# Patient Record
Sex: Male | Born: 1960 | Race: White | Hispanic: No | Marital: Married | Smoking: Never smoker
Health system: Southern US, Community
[De-identification: ages and names within clinical notes are randomized; demographics above are authoritative.]

---

## 2006-07-14 ENCOUNTER — Encounter: Admission: RE | Admit: 2006-07-14 | Discharge: 2006-07-14 | Payer: Self-pay | Admitting: Orthopedic Surgery

## 2015-03-30 ENCOUNTER — Ambulatory Visit (INDEPENDENT_AMBULATORY_CARE_PROVIDER_SITE_OTHER): Payer: BLUE CROSS/BLUE SHIELD | Admitting: Physician Assistant

## 2015-03-30 ENCOUNTER — Encounter: Payer: Self-pay | Admitting: Physician Assistant

## 2015-03-30 ENCOUNTER — Ambulatory Visit (INDEPENDENT_AMBULATORY_CARE_PROVIDER_SITE_OTHER): Payer: BLUE CROSS/BLUE SHIELD

## 2015-03-30 VITALS — BP 144/76 | HR 76 | Temp 98.3°F | Resp 18

## 2015-03-30 DIAGNOSIS — M79675 Pain in left toe(s): Secondary | ICD-10-CM | POA: Diagnosis not present

## 2015-03-30 DIAGNOSIS — S91212A Laceration without foreign body of left great toe with damage to nail, initial encounter: Secondary | ICD-10-CM

## 2015-03-30 DIAGNOSIS — S91119A Laceration without foreign body of unspecified toe without damage to nail, initial encounter: Secondary | ICD-10-CM

## 2015-03-30 DIAGNOSIS — Z23 Encounter for immunization: Secondary | ICD-10-CM | POA: Diagnosis not present

## 2015-03-30 DIAGNOSIS — S99922A Unspecified injury of left foot, initial encounter: Secondary | ICD-10-CM

## 2015-03-30 MED ORDER — CEPHALEXIN 500 MG PO CAPS: 500.0000 mg | ORAL_CAPSULE | Freq: Three times a day (TID) | ORAL | Status: DC

## 2015-03-30 MED ORDER — HYDROCODONE-ACETAMINOPHEN 5-325 MG PO TABS: 1.0000 | ORAL_TABLET | Freq: Four times a day (QID) | ORAL | Status: DC | PRN

## 2015-03-30 NOTE — Patient Instructions (Signed)

## 2015-03-30 NOTE — Progress Notes (Signed)
   Subjective:    Patient ID: Philip Tucker, male    DOB: 02-01-1961, 54 y.o.   MRN: [id number]  HPI Patient present for left toe pain and laceration following a steel plate from a [employer] machine falling onto his toe this morning. Toe is no longer painful, but is now numb, however, when he was driving here toe pain was intense and felt nauseous. Not sure when he last received tetanus. NKDA.   Review of Systems  Constitutional: Positive for diaphoresis. Negative for fever and chills.  Gastrointestinal: Positive for nausea. Negative for vomiting.  Musculoskeletal: Positive for gait problem.       Great toe laceration   Skin: Positive for wound.       Objective:   Physical Exam  Constitutional: He is oriented to person, place, and time. He appears well-developed and well-nourished. No distress.  Blood pressure 144/76, pulse 76, temperature 98.3 F (36.8 C), temperature source Oral, resp. rate 18, SpO2 99 %.   HENT:  Head: Normocephalic and atraumatic.  Right Ear: External ear normal.  Left Ear: External ear normal.  Eyes: Conjunctivae are normal. Right eye exhibits no discharge. Left eye exhibits no discharge. No scleral icterus.  Pulmonary/Chest: Effort normal.  Musculoskeletal:       Left foot: There is decreased range of motion (secondary to pain), tenderness and laceration (left great toe). There is no bony tenderness and no swelling.       Feet:  Neurological: He is alert and oriented to person, place, and time. He displays no atrophy. No sensory deficit. He exhibits normal muscle tone.  Skin: Skin is warm. He is not diaphoretic. There is pallor.  Psychiatric: He has a normal mood and affect. His behavior is normal. Judgment and thought content normal.   [facility] reading (PRIMARY) by  Dr. Milus Glazier.  No acute bony abnormalities.  Procedure Consent obtained. Digital block with 1:1 1% lido:0.05% marcaine mix. Wound cleaned with soap and water then explored. Toenail removed. Top  of distal phalanx exposed. #18 5-0 ethilon simple interrupted sutures placed. Clean dressing placed. Care instructions discussed.     Assessment & Plan:  1. Toe laceration, initial encounter 2. Toe injury, left, initial encounter 3. Pain of toe of left foot RTC 04/01/15 for wound check.  - Tdap vaccine greater than or equal to 7yo IM - cephALEXin (KEFLEX) 500 MG capsule; Take 1 capsule (500 mg total) by mouth 3 (three) times daily.  Dispense: 15 capsule; Refill: 0 - DG Toe Great Left; Future - HYDROcodone-acetaminophen (NORCO) 5-325 MG per tablet; Take 1 tablet by mouth every 6 (six) hours as needed for moderate pain.  Dispense: 30 tablet; Refill: 0   Olive Zmuda PA-C  [facility] 03/30/2015 2:10 PM

## 2015-04-01 ENCOUNTER — Ambulatory Visit (INDEPENDENT_AMBULATORY_CARE_PROVIDER_SITE_OTHER): Payer: BLUE CROSS/BLUE SHIELD | Admitting: Physician Assistant

## 2015-04-01 VITALS — BP 147/89 | HR 89 | Temp 98.9°F | Resp 18 | Ht 72.0 in | Wt 255.0 lb

## 2015-04-01 DIAGNOSIS — S91119D Laceration without foreign body of unspecified toe without damage to nail, subsequent encounter: Secondary | ICD-10-CM

## 2015-04-01 DIAGNOSIS — S99922D Unspecified injury of left foot, subsequent encounter: Secondary | ICD-10-CM

## 2015-04-01 DIAGNOSIS — Z5189 Encounter for other specified aftercare: Secondary | ICD-10-CM

## 2015-04-01 DIAGNOSIS — S91212A Laceration without foreign body of left great toe with damage to nail, initial encounter: Secondary | ICD-10-CM

## 2015-04-01 NOTE — Progress Notes (Signed)
   Subjective:    Patient ID: Philip Tucker, male    DOB: 09/13/1960, 54 y.o.   MRN: [id number]  HPI Patient presents for wound care following laceration repair of great on of left foot when a steel plate fell onto it. States that pain has been manageable up until today when a toddler crawled onto foot. Has not taken Norco at all and has only used Aleve twice over the weekend. Taking Keflex without any side effects. Denies fever or redness. Has not changed dressing so unaware of any drainage. Did have some bleeding the first 2 days.   Review of Systems  Constitutional: Negative for fever and chills.  Musculoskeletal: Negative for joint swelling.  Skin: Positive for wound. Negative for color change.  Neurological: Negative for numbness.       Objective:   Physical Exam  Constitutional: He is oriented to person, place, and time. He appears well-developed and well-nourished. No distress.  Blood pressure 147/89, pulse 89, temperature 98.9 F (37.2 C), temperature source Oral, resp. rate 18, height 6' (1.829 m), weight 255 lb (115.667 kg), SpO2 98 %.   HENT:  Head: Normocephalic and atraumatic.  Right Ear: External ear normal.  Left Ear: External ear normal.  Eyes: Conjunctivae are normal. Right eye exhibits no discharge. Left eye exhibits no discharge. No scleral icterus.  Pulmonary/Chest: Effort normal.  Neurological: He is alert and oriented to person, place, and time.  Skin: Skin is warm. No rash noted. He is not diaphoretic. No erythema. No pallor.  Soaked dressing off of toe. Underlying tissue is healing well where skin was macerated. Sutures intact. No erythema or drainage. Macerated skin bleeding with telfa removal.   Psychiatric: He has a normal mood and affect. His behavior is normal. Judgment and thought content normal.       Assessment & Plan:  1. Toe laceration, subsequent encounter 2. Toe injury, left, subsequent encounter 3. Visit for wound check Wound healing well.  RTC 04/11/15 for suture removal. Continue keflex. Norco prn. Wound care re-discussed.    Janan Ridge PA-C  [facility] 04/01/2015 6:41 PM

## 2015-04-11 ENCOUNTER — Ambulatory Visit (INDEPENDENT_AMBULATORY_CARE_PROVIDER_SITE_OTHER): Payer: BLUE CROSS/BLUE SHIELD | Admitting: Physician Assistant

## 2015-04-11 VITALS — BP 143/92 | HR 86 | Temp 98.2°F | Ht 72.0 in | Wt 254.1 lb

## 2015-04-11 DIAGNOSIS — Z5189 Encounter for other specified aftercare: Secondary | ICD-10-CM | POA: Diagnosis not present

## 2015-04-11 DIAGNOSIS — S91119D Laceration without foreign body of unspecified toe without damage to nail, subsequent encounter: Secondary | ICD-10-CM | POA: Diagnosis not present

## 2015-04-11 MED ORDER — CEPHALEXIN 500 MG PO CAPS: 500.0000 mg | ORAL_CAPSULE | Freq: Four times a day (QID) | ORAL | Status: DC

## 2015-04-11 MED ORDER — DOXYCYCLINE HYCLATE 100 MG PO CAPS: 100.0000 mg | ORAL_CAPSULE | Freq: Two times a day (BID) | ORAL | Status: AC

## 2015-04-11 NOTE — Progress Notes (Signed)
   04/11/2015 at 8:52 PM  Alfred Levins / DOB: [birth year] / MRN: [id number]  The patient  does not have a problem list on file.  SUBJECTIVE  Mr. Ortega is a well appearing 54 y.o. here today for wound care. He denies exquisite tenderness at the site of the wound, and denies nausea, emesis, fever and chills. Reports he has been wearing work boots and walking up to 5 miles per day without difficulty.  He has been compliant with 10 days Keflex and changing the dressing daily.     He  has no past medical history on file.    Medications reviewed and updated by myself where necessary, and exist elsewhere in the encounter.   Mr. Schoen has No Known Allergies. He  reports that he has never smoked. He has never used smokeless tobacco. He reports that he does not drink alcohol or use illicit drugs. He  has no sexual activity history on file. The patient  has no past surgical history on file.  His family history includes Cancer in his father.  Review of Systems  Constitutional: Negative for fever and chills.  Respiratory: Negative for shortness of breath.   Cardiovascular: Negative for chest pain.  Gastrointestinal: Negative for nausea and abdominal pain.  Genitourinary: Negative.   Skin: Negative for rash.  Neurological: Negative for dizziness and headaches.    OBJECTIVE  His  height is 6' (1.829 m) and weight is 254 lb 2 oz (115.27 kg). His oral temperature is 98.2 F (36.8 C). His blood pressure is 143/92 and his pulse is 86. His oxygen saturation is 99%.  The patient's body mass index is 34.46 kg/(m^2).  Physical Exam  Vitals reviewed. Constitutional: He is oriented to person, place, and time. He appears well-developed. No distress.  Eyes: EOM are normal. Pupils are equal, round, and reactive to light. No scleral icterus.  Neck: Normal range of motion.  Cardiovascular: Normal rate and regular rhythm.   Respiratory: Effort normal and breath sounds normal.  GI: He exhibits no distension.    Musculoskeletal: Normal range of motion.       Feet:  Neurological: He is alert and oriented to person, place, and time. No cranial nerve deficit.  Skin: Skin is warm and dry. No rash noted. He is not diaphoretic.  Psychiatric: He has a normal mood and affect.    Procedure: Risk and benefits discussed and verbal consent obtained. The patient was anesthetized using 5 cc of 2% lidocaine via digital block. Sutures removed without complication. Xeroform applied to the nail bed and clean dressing placed.     No results found for this or any previous visit (from the past 24 hour(s)).  ASSESSMENT & PLAN  Nora was seen today for suture / staple removal.  Diagnoses and all orders for this visit:  Encounter for wound care: Patient anesthetized for suture removal.  Sutures removed without difficulty.     Toe laceration, subsequent encounter: Will continue to cover with Abx therapy per Dr. Irwin Brakeman recommendation. Patient to RTC in 5 days for re-eval. He is to leave the xeroform in place and change the dressing daily.       The patient was advised to call or come back to clinic if he does not see an improvement in symptoms, or worsens with the above plan.   Deliah Boston, MHS, PA-C [facility] 04/11/2015 8:52 PM

## 2015-12-03 DIAGNOSIS — R030 Elevated blood-pressure reading, without diagnosis of hypertension: Secondary | ICD-10-CM | POA: Diagnosis not present

## 2015-12-03 DIAGNOSIS — Z008 Encounter for other general examination: Secondary | ICD-10-CM | POA: Diagnosis not present

## 2015-12-03 DIAGNOSIS — E669 Obesity, unspecified: Secondary | ICD-10-CM | POA: Diagnosis not present

## 2015-12-03 DIAGNOSIS — Z6835 Body mass index (BMI) 35.0-35.9, adult: Secondary | ICD-10-CM | POA: Diagnosis not present

## 2015-12-13 DIAGNOSIS — R030 Elevated blood-pressure reading, without diagnosis of hypertension: Secondary | ICD-10-CM | POA: Diagnosis not present

## 2016-01-08 DIAGNOSIS — E785 Hyperlipidemia, unspecified: Secondary | ICD-10-CM | POA: Diagnosis not present

## 2016-01-08 DIAGNOSIS — Z0000 Encounter for general adult medical examination without abnormal findings: Secondary | ICD-10-CM | POA: Diagnosis not present

## 2016-02-18 IMAGING — CR DG TOE GREAT 2+V*L*
1 series · 1 of 1 positions shown · non-contrast
Comparison: None.

CLINICAL DATA: Trauma to the great toe

EXAM:
LEFT GREAT TOE

[AP]
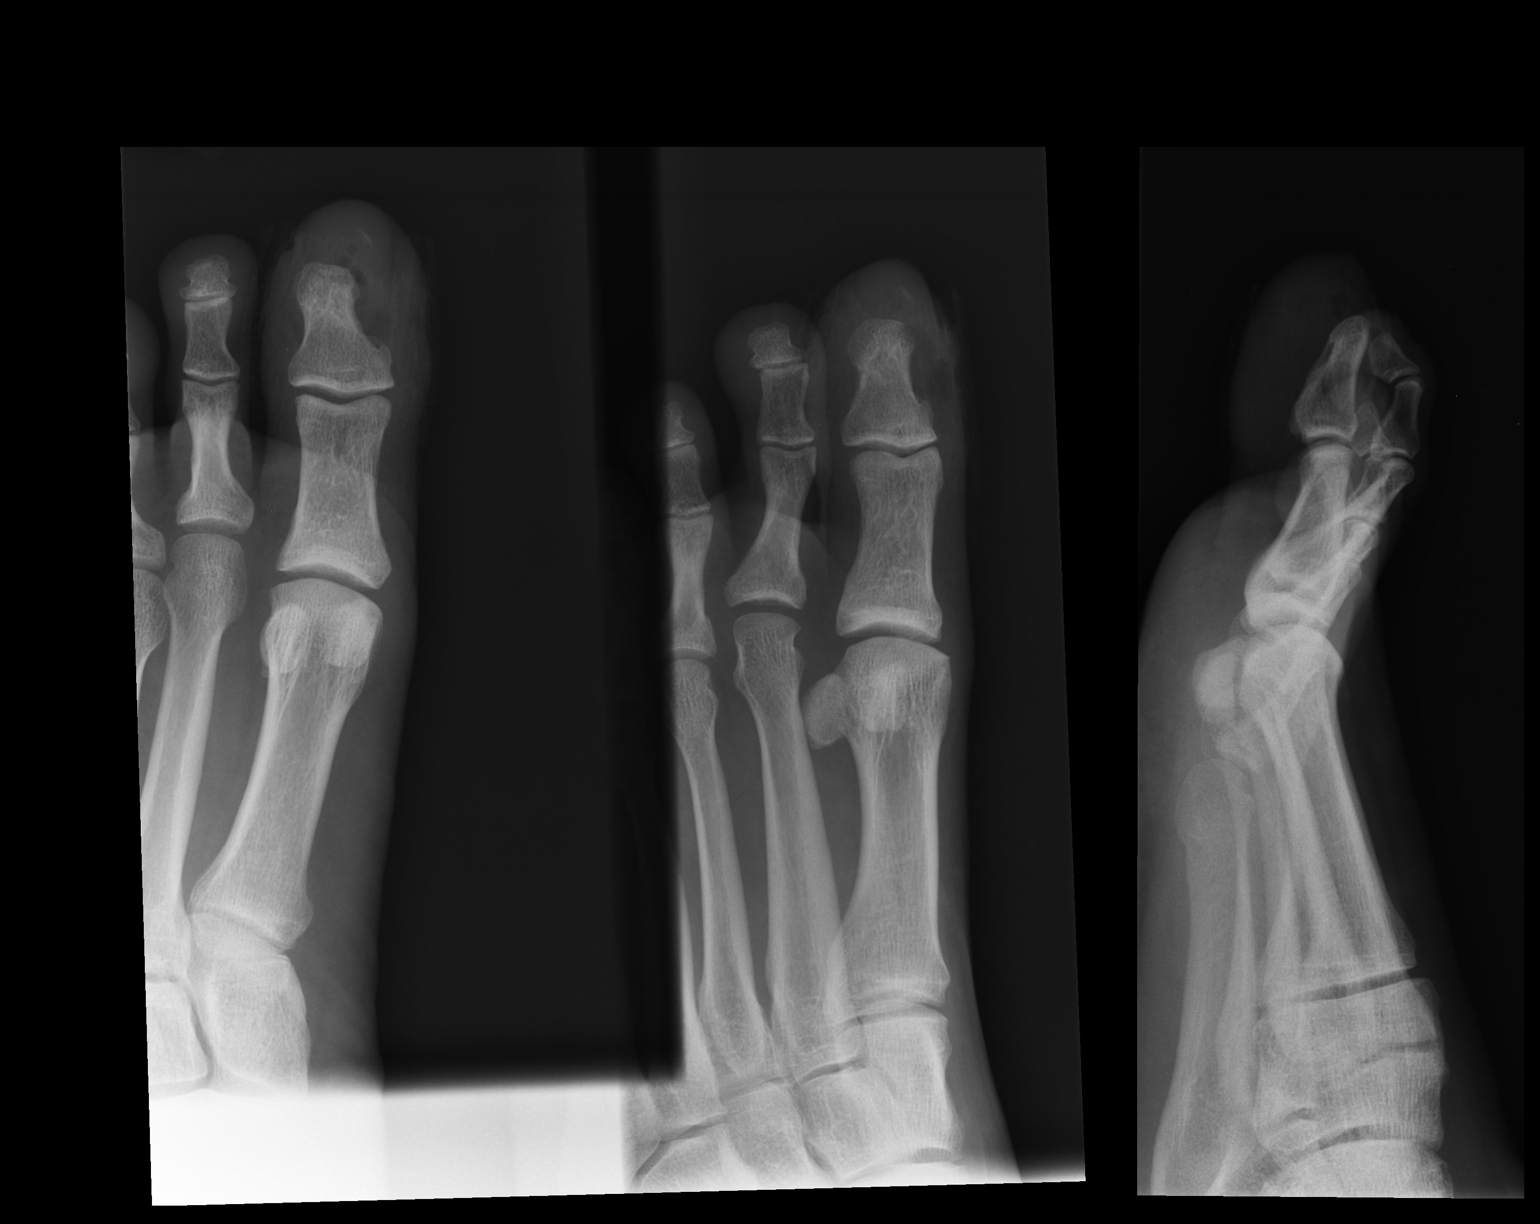

[1 of 1 positions shown; findings below may reference images not displayed]

FINDINGS: Soft tissue gas and irregularity noted involving the great toe. No
fracture or dislocation. Radiopaque material at the nail bed is
identified, correlate clinically for any concern for foreign body.
IMPRESSION: Soft tissue irregularity at the distal toe compatible with trauma
but no underlying fracture is identified.

## 2016-03-18 DIAGNOSIS — Z1211 Encounter for screening for malignant neoplasm of colon: Secondary | ICD-10-CM | POA: Diagnosis not present

## 2016-03-18 DIAGNOSIS — D122 Benign neoplasm of ascending colon: Secondary | ICD-10-CM | POA: Diagnosis not present

## 2016-03-18 DIAGNOSIS — K633 Ulcer of intestine: Secondary | ICD-10-CM | POA: Diagnosis not present

## 2016-03-31 DIAGNOSIS — Z6835 Body mass index (BMI) 35.0-35.9, adult: Secondary | ICD-10-CM | POA: Diagnosis not present

## 2016-03-31 DIAGNOSIS — E669 Obesity, unspecified: Secondary | ICD-10-CM | POA: Diagnosis not present

## 2016-03-31 DIAGNOSIS — Z008 Encounter for other general examination: Secondary | ICD-10-CM | POA: Diagnosis not present

## 2016-03-31 DIAGNOSIS — R030 Elevated blood-pressure reading, without diagnosis of hypertension: Secondary | ICD-10-CM | POA: Diagnosis not present

## 2016-04-13 DIAGNOSIS — E785 Hyperlipidemia, unspecified: Secondary | ICD-10-CM | POA: Diagnosis not present

## 2016-06-02 DIAGNOSIS — R030 Elevated blood-pressure reading, without diagnosis of hypertension: Secondary | ICD-10-CM | POA: Diagnosis not present

## 2016-06-02 DIAGNOSIS — E669 Obesity, unspecified: Secondary | ICD-10-CM | POA: Diagnosis not present

## 2016-06-02 DIAGNOSIS — Z6835 Body mass index (BMI) 35.0-35.9, adult: Secondary | ICD-10-CM | POA: Diagnosis not present

## 2016-06-02 DIAGNOSIS — Z008 Encounter for other general examination: Secondary | ICD-10-CM | POA: Diagnosis not present

## 2016-08-18 DIAGNOSIS — Z008 Encounter for other general examination: Secondary | ICD-10-CM | POA: Diagnosis not present

## 2016-08-18 DIAGNOSIS — R030 Elevated blood-pressure reading, without diagnosis of hypertension: Secondary | ICD-10-CM | POA: Diagnosis not present

## 2016-08-18 DIAGNOSIS — E669 Obesity, unspecified: Secondary | ICD-10-CM | POA: Diagnosis not present

## 2016-08-18 DIAGNOSIS — Z6835 Body mass index (BMI) 35.0-35.9, adult: Secondary | ICD-10-CM | POA: Diagnosis not present

## 2016-11-17 DIAGNOSIS — R030 Elevated blood-pressure reading, without diagnosis of hypertension: Secondary | ICD-10-CM | POA: Diagnosis not present

## 2016-11-17 DIAGNOSIS — E669 Obesity, unspecified: Secondary | ICD-10-CM | POA: Diagnosis not present

## 2016-11-17 DIAGNOSIS — Z008 Encounter for other general examination: Secondary | ICD-10-CM | POA: Diagnosis not present

## 2016-11-17 DIAGNOSIS — Z6835 Body mass index (BMI) 35.0-35.9, adult: Secondary | ICD-10-CM | POA: Diagnosis not present

## 2016-11-17 DIAGNOSIS — Z719 Counseling, unspecified: Secondary | ICD-10-CM | POA: Diagnosis not present

## 2016-12-11 DIAGNOSIS — M542 Cervicalgia: Secondary | ICD-10-CM | POA: Diagnosis not present

## 2017-03-02 DIAGNOSIS — Z719 Counseling, unspecified: Secondary | ICD-10-CM | POA: Diagnosis not present

## 2017-03-02 DIAGNOSIS — Z6837 Body mass index (BMI) 37.0-37.9, adult: Secondary | ICD-10-CM | POA: Diagnosis not present

## 2017-03-02 DIAGNOSIS — Z008 Encounter for other general examination: Secondary | ICD-10-CM | POA: Diagnosis not present

## 2017-03-02 DIAGNOSIS — E669 Obesity, unspecified: Secondary | ICD-10-CM | POA: Diagnosis not present

## 2017-03-02 DIAGNOSIS — R030 Elevated blood-pressure reading, without diagnosis of hypertension: Secondary | ICD-10-CM | POA: Diagnosis not present

## 2017-05-25 DIAGNOSIS — E669 Obesity, unspecified: Secondary | ICD-10-CM | POA: Diagnosis not present

## 2017-05-25 DIAGNOSIS — R030 Elevated blood-pressure reading, without diagnosis of hypertension: Secondary | ICD-10-CM | POA: Diagnosis not present

## 2017-05-25 DIAGNOSIS — Z7189 Other specified counseling: Secondary | ICD-10-CM | POA: Diagnosis not present

## 2017-05-25 DIAGNOSIS — Z719 Counseling, unspecified: Secondary | ICD-10-CM | POA: Diagnosis not present

## 2017-05-25 DIAGNOSIS — Z6836 Body mass index (BMI) 36.0-36.9, adult: Secondary | ICD-10-CM | POA: Diagnosis not present

## 2017-05-25 DIAGNOSIS — Z008 Encounter for other general examination: Secondary | ICD-10-CM | POA: Diagnosis not present

## 2017-07-27 DIAGNOSIS — Z719 Counseling, unspecified: Secondary | ICD-10-CM | POA: Diagnosis not present

## 2017-07-27 DIAGNOSIS — R030 Elevated blood-pressure reading, without diagnosis of hypertension: Secondary | ICD-10-CM | POA: Diagnosis not present

## 2017-07-27 DIAGNOSIS — Z008 Encounter for other general examination: Secondary | ICD-10-CM | POA: Diagnosis not present

## 2017-07-27 DIAGNOSIS — E669 Obesity, unspecified: Secondary | ICD-10-CM | POA: Diagnosis not present

## 2017-12-16 DIAGNOSIS — Z0000 Encounter for general adult medical examination without abnormal findings: Secondary | ICD-10-CM | POA: Diagnosis not present

## 2017-12-17 DIAGNOSIS — Z125 Encounter for screening for malignant neoplasm of prostate: Secondary | ICD-10-CM | POA: Diagnosis not present

## 2017-12-17 DIAGNOSIS — E7800 Pure hypercholesterolemia, unspecified: Secondary | ICD-10-CM | POA: Diagnosis not present

## 2017-12-27 DIAGNOSIS — L918 Other hypertrophic disorders of the skin: Secondary | ICD-10-CM | POA: Diagnosis not present

## 2018-02-15 DIAGNOSIS — Z6836 Body mass index (BMI) 36.0-36.9, adult: Secondary | ICD-10-CM | POA: Diagnosis not present

## 2018-02-15 DIAGNOSIS — Z008 Encounter for other general examination: Secondary | ICD-10-CM | POA: Diagnosis not present

## 2018-02-15 DIAGNOSIS — Z719 Counseling, unspecified: Secondary | ICD-10-CM | POA: Diagnosis not present

## 2018-02-15 DIAGNOSIS — E669 Obesity, unspecified: Secondary | ICD-10-CM | POA: Diagnosis not present

## 2018-06-14 DIAGNOSIS — Z6836 Body mass index (BMI) 36.0-36.9, adult: Secondary | ICD-10-CM | POA: Diagnosis not present

## 2018-06-14 DIAGNOSIS — E669 Obesity, unspecified: Secondary | ICD-10-CM | POA: Diagnosis not present

## 2018-06-14 DIAGNOSIS — Z008 Encounter for other general examination: Secondary | ICD-10-CM | POA: Diagnosis not present

## 2018-06-14 DIAGNOSIS — Z719 Counseling, unspecified: Secondary | ICD-10-CM | POA: Diagnosis not present

## 2018-09-13 DIAGNOSIS — Z008 Encounter for other general examination: Secondary | ICD-10-CM | POA: Diagnosis not present

## 2018-09-13 DIAGNOSIS — Z6836 Body mass index (BMI) 36.0-36.9, adult: Secondary | ICD-10-CM | POA: Diagnosis not present

## 2018-09-13 DIAGNOSIS — Z719 Counseling, unspecified: Secondary | ICD-10-CM | POA: Diagnosis not present

## 2018-09-13 DIAGNOSIS — E669 Obesity, unspecified: Secondary | ICD-10-CM | POA: Diagnosis not present

## 2018-12-23 DIAGNOSIS — Z0000 Encounter for general adult medical examination without abnormal findings: Secondary | ICD-10-CM | POA: Diagnosis not present
